# Patient Record
Sex: Female | Born: 2005 | Race: Black or African American | Hispanic: Yes | Marital: Single | Smoking: Never smoker
Health system: Southern US, Community
[De-identification: ages and names within clinical notes are randomized; demographics above are authoritative.]

## PROBLEM LIST (undated history)

## (undated) DIAGNOSIS — F32A Depression, unspecified: Secondary | ICD-10-CM

---
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
## 2024-01-15 ENCOUNTER — Emergency Department (HOSPITAL_COMMUNITY)
Admission: EM | Admit: 2024-01-15 | Discharge: 2024-01-16 | Disposition: A | Discharge status: Home / Self Care | Payer: MEDICAID | Attending: Emergency Medicine | Admitting: Emergency Medicine

## 2024-01-15 ENCOUNTER — Other Ambulatory Visit: Payer: Self-pay

## 2024-01-15 ENCOUNTER — Encounter (HOSPITAL_COMMUNITY): Payer: Self-pay | Admitting: *Deleted

## 2024-01-15 DIAGNOSIS — R112 Nausea with vomiting, unspecified: Secondary | ICD-10-CM | POA: Diagnosis present

## 2024-01-15 DIAGNOSIS — E876 Hypokalemia: Secondary | ICD-10-CM | POA: Insufficient documentation

## 2024-01-15 DIAGNOSIS — R197 Diarrhea, unspecified: Secondary | ICD-10-CM | POA: Insufficient documentation

## 2024-01-15 LAB — CBC
HCT: 36.9 % (ref 36.0–46.0)
Hemoglobin: 10.5 g/dL — ABNORMAL LOW (ref 12.0–15.0)
MCH: 19.4 pg — ABNORMAL LOW (ref 26.0–34.0)
MCHC: 28.5 g/dL — ABNORMAL LOW (ref 30.0–36.0)
MCV: 68.2 fL — ABNORMAL LOW (ref 80.0–100.0)
Platelets: 317 K/uL (ref 150–400)
RBC: 5.41 MIL/uL — ABNORMAL HIGH (ref 3.87–5.11)
RDW: 17.3 % — ABNORMAL HIGH (ref 11.5–15.5)
WBC: 8 K/uL (ref 4.0–10.5)
nRBC: 0 % (ref 0.0–0.2)

## 2024-01-15 NOTE — ED Triage Notes (Signed)
 The pt is c/o abd pain  with n v and diarrhea since Tuesday   no temp   lmp aug 20th

## 2024-01-16 LAB — URINALYSIS, ROUTINE W REFLEX MICROSCOPIC
Bilirubin Urine: NEGATIVE
Glucose, UA: NEGATIVE mg/dL
Ketones, ur: NEGATIVE mg/dL
Nitrite: NEGATIVE
Protein, ur: NEGATIVE mg/dL
Specific Gravity, Urine: 1.03 (ref 1.005–1.030)
pH: 5 (ref 5.0–8.0)

## 2024-01-16 LAB — COMPREHENSIVE METABOLIC PANEL WITH GFR
ALT: 16 U/L (ref 0–44)
AST: 20 U/L (ref 15–41)
Albumin: 4 g/dL (ref 3.5–5.0)
Alkaline Phosphatase: 73 U/L (ref 38–126)
Anion gap: 11 (ref 5–15)
BUN: 12 mg/dL (ref 6–20)
CO2: 23 mmol/L (ref 22–32)
Calcium: 9.3 mg/dL (ref 8.9–10.3)
Chloride: 107 mmol/L (ref 98–111)
Creatinine, Ser: 0.77 mg/dL (ref 0.44–1.00)
GFR, Estimated: >60 mL/min (ref >60)
Glucose, Bld: 84 mg/dL (ref 70–99)
Potassium: 3.2 mmol/L — ABNORMAL LOW (ref 3.5–5.1)
Sodium: 141 mmol/L (ref 135–145)
Total Bilirubin: 0.3 mg/dL (ref 0.0–1.2)
Total Protein: 7.8 g/dL (ref 6.5–8.1)

## 2024-01-16 LAB — HCG, SERUM, QUALITATIVE: Preg, Serum: NEGATIVE

## 2024-01-16 LAB — LIPASE, BLOOD: Lipase: 29 U/L (ref 11–51)

## 2024-01-16 MED ORDER — ONDANSETRON HCL 4 MG/2ML IJ SOLN
4.0000 mg | Freq: Once | INTRAMUSCULAR | Status: AC
  Administered 2024-01-16: 4 mg via INTRAVENOUS
  Filled 2024-01-16: qty 2

## 2024-01-16 MED ORDER — MORPHINE SULFATE (PF) 4 MG/ML IV SOLN
4.0000 mg | Freq: Once | INTRAVENOUS | Status: AC
  Administered 2024-01-16: 4 mg via INTRAVENOUS
  Filled 2024-01-16: qty 1

## 2024-01-16 MED ORDER — SODIUM CHLORIDE 0.9 % IV BOLUS
1000.0000 mL | Freq: Once | INTRAVENOUS | Status: AC
  Administered 2024-01-16: 1000 mL via INTRAVENOUS

## 2024-01-16 MED ORDER — ONDANSETRON HCL 4 MG PO TABS: 4.0000 mg | ORAL_TABLET | Freq: Four times a day (QID) | ORAL | 0 refills | Status: DC

## 2024-01-16 MED ORDER — POTASSIUM CHLORIDE CRYS ER 20 MEQ PO TBCR
40.0000 meq | EXTENDED_RELEASE_TABLET | Freq: Once | ORAL | Status: AC
  Administered 2024-01-16: 40 meq via ORAL
  Filled 2024-01-16: qty 2

## 2024-01-16 MED ORDER — FERROUS SULFATE 325 (65 FE) MG PO TABS: 325.0000 mg | ORAL_TABLET | Freq: Every day | ORAL | 0 refills | Status: AC

## 2024-01-16 NOTE — ED Provider Notes (Signed)
 [facility] EMERGENCY DEPARTMENT AT [facility] Provider Note   CSN: [id number] Arrival date & time: 01/15/24  2254     Patient presents with: Abdominal Pain  HPI Allison Melendez is a 18 y.o. female presenting for abdominal pain nausea vomiting and diarrhea.  Started this past Tuesday.  States the vomiting stopped about 3 days ago but still having diarrhea.  Denies any recent new medications or antibiotics.  Abdominal pain is on the left side of the abdomen and nonradiating otherwise.  Denies urinary or vaginal symptoms but states she may be on her period.  Denies recent trips abroad.  Denies recent alcohol or marijuana use. Also requesting refill on iron supplement.     Abdominal Pain      Prior to Admission medications   Medication Sig Start Date End Date Taking? Authorizing Provider  ondansetron  (ZOFRAN ) 4 MG tablet Take 1 tablet (4 mg total) by mouth every 6 (six) hours. 01/16/24  Yes Lang Norleen POUR, PA-C  cyclobenzaprine  (FLEXERIL ) 10 MG tablet Take 1 tablet (10 mg total) by mouth 2 (two) times daily as needed for muscle spasms. 10/25/22   Schutt, Marsa HERO, PA-C  dexmethylphenidate (FOCALIN XR) 5 MG 24 hr capsule Take 5 mg by mouth daily. 02/01/22   [provider]  escitalopram (LEXAPRO) 10 MG tablet Take by mouth. 01/27/22   [provider]  ferrous sulfate 325 (65 FE) MG tablet Take 1 tablet (325 mg total) by mouth daily. 01/16/24 02/15/24  Lang Norleen POUR, PA-C  ondansetron  (ZOFRAN -ODT) 4 MG disintegrating tablet Take 1 tablet (4 mg total) by mouth every 8 (eight) hours as needed for nausea or vomiting. 04/08/23   Rolinda Rogue, MD  oseltamivir  (TAMIFLU ) 75 MG capsule Take 1 capsule (75 mg total) by mouth 2 (two) times daily. 05/13/22   Piontek, Rocky, MD  pantoprazole  (PROTONIX ) 40 MG tablet Take 1 tablet (40 mg total) by mouth daily. 04/08/23   Rolinda Rogue, MD    Allergies: Patient has no known allergies.    Review of Systems   Gastrointestinal:  Positive for abdominal pain.    Updated Vital Signs BP (!) 136/118   Pulse 75   Temp 98.6 F (37 C)   Resp 18   Ht 5' 4 (1.626 m)   Wt 112.1 kg   LMP 12/09/2023   SpO2 100%   BMI 42.42 kg/m   Physical Exam Vitals and nursing note reviewed.  HENT:     Head: Normocephalic and atraumatic.     Mouth/Throat:     Mouth: Mucous membranes are moist.  Eyes:     General:        Right eye: No discharge.        Left eye: No discharge.     Conjunctiva/sclera: Conjunctivae normal.  Cardiovascular:     Rate and Rhythm: Normal rate and regular rhythm.     Pulses: Normal pulses.     Heart sounds: Normal heart sounds.  Pulmonary:     Effort: Pulmonary effort is normal.     Breath sounds: Normal breath sounds.  Abdominal:     General: Abdomen is flat. There is no distension.     Palpations: Abdomen is soft.     Tenderness: There is no abdominal tenderness.  Skin:    General: Skin is warm and dry.  Neurological:     General: No focal deficit present.  Psychiatric:        Mood and Affect: Mood normal.     (all  labs ordered are listed, but only abnormal results are displayed) Labs Reviewed  COMPREHENSIVE METABOLIC PANEL WITH GFR - Abnormal; Notable for the following components:      Result Value   Potassium 3.2 (*)    All other components within normal limits  CBC - Abnormal; Notable for the following components:   RBC 5.41 (*)    Hemoglobin 10.5 (*)    MCV 68.2 (*)    MCH 19.4 (*)    MCHC 28.5 (*)    RDW 17.3 (*)    All other components within normal limits  URINALYSIS, ROUTINE W REFLEX MICROSCOPIC - Abnormal; Notable for the following components:   APPearance CLOUDY (*)    Hgb urine dipstick SMALL (*)    Leukocytes,Ua MODERATE (*)    Bacteria, UA RARE (*)    All other components within normal limits  LIPASE, BLOOD  HCG, SERUM, QUALITATIVE    EKG: None  Radiology: No results found.   Procedures   Medications Ordered in the ED  morphine  (PF) 4 MG/ML injection 4 mg (4 mg Intravenous Given 01/16/24 0955)  sodium chloride 0.9 % bolus 1,000 mL (1,000 mLs Intravenous New Bag/Given 01/16/24 0954)  ondansetron  (ZOFRAN ) injection 4 mg (4 mg Intravenous Given 01/16/24 0955)  potassium chloride SA (KLOR-CON M) CR tablet 40 mEq (40 mEq Oral Given 01/16/24 0955)                                    Medical Decision Making  Initial Impression and Ddx 18 year old well-appearing female presenting for abdominal pain.  Exam unremarkable.  DDx includes ectopic pregnancy, ovarian torsion, appendicitis, acute cholecystitis, kidney stone, acute pancreatitis, electrolyte derangement, other. Patient PMH that increases complexity of ED encounter:  none  Interpretation of Diagnostics I independent reviewed and interpreted the labs as followed: mild anemia 10.5, hematuria, pyuria, mucus and squamous cells present urine   Patient Reassessment and Ultimate Disposition/Management Workup overall reassuring.  Suspicion for intra-abdominal pathology is low given reassuring abdominal exam and lab work and patient appears well and nontoxic.  Vitals are also reassuring with slightly elevated blood pressure.  Urine was somewhat concerning but given lack of urinary symptoms and patient did mention that she may be menstruating is likely contamination.  Sent Zofran  to her pharmacy.  Fluid challenge here with no issue.  Discussed return precautions and advised PCP follow-up.  Discharged in good condition.  Patient management required discussion with the following services or consulting groups:  None  Complexity of Problems Addressed Acute complicated illness or Injury  Additional Data Reviewed and Analyzed Further history obtained from: Past medical history and medications listed in the EMR and Prior ED visit notes  Patient Encounter Risk Assessment Consideration of hospitalization      Final diagnoses:  Nausea vomiting and diarrhea    ED Discharge  Orders          Ordered    ferrous sulfate 325 (65 FE) MG tablet  Daily        01/16/24 0946    ondansetron  (ZOFRAN ) 4 MG tablet  Every 6 hours        01/16/24 1042               Lang Norleen POUR, PA-C 01/16/24 1042    Rogelia Jerilynn RAMAN, MD 01/20/24 [phone]

## 2024-01-16 NOTE — Discharge Instructions (Signed)
 Evaluation today was reassuring.  I did send refills of your iron supplement to your pharmacy as well as Zofran  to help with nausea and vomiting.  Please follow-up with your primary care doctor.  If you have worsening abdominal pain, develop a fever, cannot tolerate fluid intake or any other concerning symptom please return to the ED for further evaluation.

## 2024-02-04 ENCOUNTER — Other Ambulatory Visit: Payer: Self-pay

## 2024-02-04 ENCOUNTER — Encounter (HOSPITAL_COMMUNITY): Payer: Self-pay

## 2024-02-04 ENCOUNTER — Ambulatory Visit (HOSPITAL_COMMUNITY)
Admission: RE | Admit: 2024-02-04 | Discharge: 2024-02-04 | Disposition: A | Discharge status: Home / Self Care | Payer: MEDICAID | Source: Ambulatory Visit | Attending: Internal Medicine | Admitting: Internal Medicine

## 2024-02-04 VITALS — BP 134/90 | HR 78 | Temp 99.4°F | Resp 20

## 2024-02-04 DIAGNOSIS — N926 Irregular menstruation, unspecified: Secondary | ICD-10-CM | POA: Insufficient documentation

## 2024-02-04 DIAGNOSIS — Z3202 Encounter for pregnancy test, result negative: Secondary | ICD-10-CM | POA: Insufficient documentation

## 2024-02-04 DIAGNOSIS — N898 Other specified noninflammatory disorders of vagina: Secondary | ICD-10-CM | POA: Insufficient documentation

## 2024-02-04 DIAGNOSIS — R300 Dysuria: Secondary | ICD-10-CM | POA: Diagnosis present

## 2024-02-04 DIAGNOSIS — N3001 Acute cystitis with hematuria: Secondary | ICD-10-CM | POA: Insufficient documentation

## 2024-02-04 LAB — POCT URINALYSIS DIP (MANUAL ENTRY)
Bilirubin, UA: NEGATIVE
Glucose, UA: NEGATIVE mg/dL
Ketones, POC UA: NEGATIVE mg/dL
Nitrite, UA: NEGATIVE
Protein Ur, POC: NEGATIVE mg/dL
Spec Grav, UA: 1.025 (ref 1.010–1.025)
Urobilinogen, UA: 0.2 U/dL
pH, UA: 5.5 (ref 5.0–8.0)

## 2024-02-04 LAB — POCT URINE PREGNANCY: Preg Test, Ur: NEGATIVE

## 2024-02-04 LAB — HIV ANTIBODY (ROUTINE TESTING W REFLEX): HIV Screen 4th Generation wRfx: NONREACTIVE

## 2024-02-04 MED ORDER — CEPHALEXIN 500 MG PO CAPS: 500.0000 mg | ORAL_CAPSULE | Freq: Two times a day (BID) | ORAL | 0 refills | Status: AC

## 2024-02-04 NOTE — ED Provider Notes (Signed)
 [facility]    CSN: [id number] Arrival date & time: 02/04/24  1402      History   Chief Complaint Chief Complaint  Patient presents with   Abdominal Pain    I have UTI/BV and its causing bad stomach pain and burns to pee and smells down in my private area. - Entered by patient   Appointment    2:00    HPI Allison Melendez is a 18 y.o. female.   Allison Melendez is a 18 y.o. female presenting for chief complaint of dysuria, urinary frequency, urinary urgency, sensation of incomplete bladder emptying, and suprapubic pressure that started approximately 1 week ago.  She additionally complains of vaginal discharge that is brown in color, thin, and with vaginal odor/irritation.  She is sexually active unprotected and in a monogamous relationship with her boyfriend.  No known exposures to STDs or concern for STD, however she would like to be tested for STDs today just to make sure.  Reports intermittent nausea without vomiting.  She is experiencing pain to the suprapubic abdomen.  Denies low back pain, fever, chills, flank pain, diarrhea, blood/mucus in the stool, gross hematuria, recent antibiotic or steroid use, and history of frequent UTIs.  She consistently voids after intercourse and denies use of SGLT2 inhibitor.  She has not attempted treatment of symptoms at home.  Last menstrual cycle started on December 09, 2023.  Her menstrual cycles are usually very regular and it is abnormal for her to skip a cycle.  She is unsure if she could be pregnant and does not use any form of birth control.   Abdominal Pain   History reviewed. No pertinent past medical history.  There are no active problems to display for this patient.   History reviewed. No pertinent surgical history.  OB History   No obstetric history on file.      Home Medications    Prior to Admission medications   Medication Sig Start Date End Date Taking? Authorizing Provider  cephALEXin (KEFLEX) 500 MG  capsule Take 1 capsule (500 mg total) by mouth 2 (two) times daily for 7 days. 02/04/24 02/11/24 Yes StanhopeDorna HERO, FNP  cyclobenzaprine  (FLEXERIL ) 10 MG tablet Take 1 tablet (10 mg total) by mouth 2 (two) times daily as needed for muscle spasms. 10/25/22   Schutt, Marsa HERO, PA-C  dexmethylphenidate (FOCALIN XR) 5 MG 24 hr capsule Take 5 mg by mouth daily. 02/01/22   [provider]  escitalopram (LEXAPRO) 10 MG tablet Take by mouth. 01/27/22   [provider]  ferrous sulfate 325 (65 FE) MG tablet Take 1 tablet (325 mg total) by mouth daily. 01/16/24 02/15/24  Robinson, John K, PA-C  ondansetron  (ZOFRAN ) 4 MG tablet Take 1 tablet (4 mg total) by mouth every 6 (six) hours. 01/16/24   Robinson, John K, PA-C  ondansetron  (ZOFRAN -ODT) 4 MG disintegrating tablet Take 1 tablet (4 mg total) by mouth every 8 (eight) hours as needed for nausea or vomiting. 04/08/23   Rolinda Rogue, MD  oseltamivir  (TAMIFLU ) 75 MG capsule Take 1 capsule (75 mg total) by mouth 2 (two) times daily. 05/13/22   Piontek, Rocky, MD  pantoprazole  (PROTONIX ) 40 MG tablet Take 1 tablet (40 mg total) by mouth daily. 04/08/23   Rolinda Rogue, MD    Family History History reviewed. No pertinent family history.  Social History Social History   Tobacco Use   Smoking status: Never   Smokeless tobacco: Never  Vaping Use   Vaping status:  Every Day  Substance Use Topics   Alcohol use: No   Drug use: No     Allergies   Patient has no known allergies.   Review of Systems Review of Systems  Gastrointestinal:  Positive for abdominal pain.  Per HPI   Physical Exam Triage Vital Signs ED Triage Vitals  Encounter Vitals Group     BP 02/04/24 1425 (!) 134/90     Girls Systolic BP Percentile --      Girls Diastolic BP Percentile --      Boys Systolic BP Percentile --      Boys Diastolic BP Percentile --      Pulse Rate 02/04/24 1425 78     Resp 02/04/24 1425 20     Temp 02/04/24 1425 99.4 F (37.4  C)     Temp Source 02/04/24 1425 Oral     SpO2 02/04/24 1425 97 %     Weight --      Height --      Head Circumference --      Peak Flow --      Pain Score 02/04/24 1422 7     Pain Loc --      Pain Education --      Exclude from Growth Chart --    No data found.  Updated Vital Signs BP (!) 134/90 (BP Location: Right Arm) Comment (BP Location): large cuff  Pulse 78   Temp 99.4 F (37.4 C) (Oral)   Resp 20   LMP 12/09/2023 (Approximate)   SpO2 97%   Visual Acuity Right Eye Distance:   Left Eye Distance:   Bilateral Distance:    Right Eye Near:   Left Eye Near:    Bilateral Near:     Physical Exam Vitals and nursing note reviewed.  Constitutional:      Appearance: She is not ill-appearing or toxic-appearing.  HENT:     Head: Normocephalic and atraumatic.     Right Ear: Hearing and external ear normal.     Left Ear: Hearing and external ear normal.     Nose: Nose normal.     Mouth/Throat:     Lips: Pink.  Eyes:     General: Lids are normal. Vision grossly intact. Gaze aligned appropriately.     Extraocular Movements: Extraocular movements intact.     Conjunctiva/sclera: Conjunctivae normal.  Pulmonary:     Effort: Pulmonary effort is normal.  Abdominal:     General: Bowel sounds are normal.     Palpations: Abdomen is soft.     Tenderness: There is no abdominal tenderness. There is no right CVA tenderness, left CVA tenderness or guarding.  Musculoskeletal:     Cervical back: Neck supple.  Skin:    General: Skin is warm and dry.     Capillary Refill: Capillary refill takes less than 2 seconds.     Findings: No rash.  Neurological:     General: No focal deficit present.     Mental Status: She is alert and oriented to person, place, and time. Mental status is at baseline.     Cranial Nerves: No dysarthria or facial asymmetry.  Psychiatric:        Mood and Affect: Mood normal.        Speech: Speech normal.        Behavior: Behavior normal.        Thought  Content: Thought content normal.        Judgment: Judgment normal.  UC Treatments / Results  Labs (all labs ordered are listed, but only abnormal results are displayed) Labs Reviewed  POCT URINALYSIS DIP (MANUAL ENTRY) - Abnormal; Notable for the following components:      Result Value   Clarity, UA cloudy (*)    Blood, UA trace-intact (*)    Leukocytes, UA Small (1+) (*)    All other components within normal limits  POCT URINE PREGNANCY - Normal  RPR  HIV ANTIBODY (ROUTINE TESTING W REFLEX)    EKG   Radiology No results found.  Procedures Procedures (including critical care time)  Medications Ordered in UC Medications - No data to display  Initial Impression / Assessment and Plan / UC Course  I have reviewed the triage vital signs and the nursing notes.  Pertinent labs & imaging results that were available during my care of the patient were reviewed by me and considered in my medical decision making (see chart for details).   1. Acute cystitis with hematuria, dysuria Evaluation suggests acute cystitis based on presentation and urinalysis findings in clinic.  Urine culture pending.  Low suspicion for acute pyelonephritis, kidney stone or infected stone.  Keflex antibiotic ordered. Appears well hydrated, therefore will defer labs/imaging.  Vitals stable.  Patient to push fluids to stay well hydrated and reduce intake of known urinary irritants.  Discussed methods of preventing future UTI.   2. Vaginal discharge, missed menses, negative pregnancy test STI labs pending, will notify patient of positive results and treat accordingly per protocol when labs result.   Patient would like HIV and syphilis testing today.   Patient to avoid sexual intercourse until screening testing comes back.   Education provided regarding safe sexual practices and patient encouraged to use protection to prevent spread of STIs.  Negative urine pregnancy test.   Counseled patient on  potential for adverse effects with medications prescribed/recommended today, strict ER and return-to-clinic precautions discussed, patient verbalized understanding.    Final Clinical Impressions(s) / UC Diagnoses   Final diagnoses:  Missed menses  Dysuria  Acute cystitis with hematuria  Negative pregnancy test  Vaginal discharge     Discharge Instructions      Your urine shows you likely have a urinary tract infection.   I have sent your urine for culture to confirm this.   We will call you if we need to change your antibiotic when we find out the type of bacteria growing in your bladder.  Take antibiotic as directed with a snack/food to avoid stomach upset. To avoid GI upset please take this medication with food.   Avoid drinking beverages that irritate the urinary tract like sodas, tea, coffee, or juice. Drink plenty of water to stay well hydrated and prevent severe infection.  If you develop pain in your upper back on one side, fever despite taking antibiotic, nausea and vomiting where you cannot keep anything down for 24 hours, dizziness/severe headache, or decreased urinary output, please go to the ER.   STD testing pending, this will take 2-3 days to result. We will only call you if your testing is positive for any infection(s) and we will provide treatment.  Avoid sexual intercourse until your STD results come back.  If any of your STD results are positive, you will need to avoid sexual intercourse for 7 days while you are being treated to prevent spread of STD.  Condom use is the best way to prevent spread of STDs. Notify partner(s) of any positive results.  Return to urgent  care as needed.       ED Prescriptions     Medication Sig Dispense Auth. Provider   cephALEXin (KEFLEX) 500 MG capsule Take 1 capsule (500 mg total) by mouth 2 (two) times daily for 7 days. 14 capsule Enedelia Dorna HERO, FNP      PDMP not reviewed this encounter.   Enedelia Dorna HERO, [place] 02/04/24 1540

## 2024-02-04 NOTE — Discharge Instructions (Addendum)
 Your urine shows you likely have a urinary tract infection.   I have sent your urine for culture to confirm this.   We will call you if we need to change your antibiotic when we find out the type of bacteria growing in your bladder.  Take antibiotic as directed with a snack/food to avoid stomach upset. To avoid GI upset please take this medication with food.   Avoid drinking beverages that irritate the urinary tract like sodas, tea, coffee, or juice. Drink plenty of water to stay well hydrated and prevent severe infection.  If you develop pain in your upper back on one side, fever despite taking antibiotic, nausea and vomiting where you cannot keep anything down for 24 hours, dizziness/severe headache, or decreased urinary output, please go to the ER.   STD testing pending, this will take 2-3 days to result. We will only call you if your testing is positive for any infection(s) and we will provide treatment.  Avoid sexual intercourse until your STD results come back.  If any of your STD results are positive, you will need to avoid sexual intercourse for 7 days while you are being treated to prevent spread of STD.  Condom use is the best way to prevent spread of STDs. Notify partner(s) of any positive results.  Return to urgent care as needed.

## 2024-02-04 NOTE — ED Triage Notes (Signed)
 Complains of abdominal pain, burning with urination, strong odor to urine.  Uses dove sensitive soap.  Has brown vaginal discharge that started last week.    Has not used any medications for these symptoms  LMP August 20th, is not on any birth control.

## 2024-02-05 ENCOUNTER — Ambulatory Visit (HOSPITAL_COMMUNITY): Payer: Self-pay

## 2024-02-05 LAB — URINE CULTURE: Culture: <10000 — AB

## 2024-02-05 LAB — RPR: RPR Ser Ql: NONREACTIVE

## 2024-02-28 ENCOUNTER — Ambulatory Visit (HOSPITAL_COMMUNITY)
Admission: RE | Admit: 2024-02-28 | Discharge: 2024-02-28 | Disposition: A | Discharge status: Home / Self Care | Payer: MEDICAID | Source: Ambulatory Visit

## 2024-02-28 ENCOUNTER — Encounter (HOSPITAL_COMMUNITY): Payer: Self-pay

## 2024-02-28 VITALS — BP 135/98 | HR 84 | Temp 98.8°F | Resp 16

## 2024-02-28 DIAGNOSIS — R300 Dysuria: Secondary | ICD-10-CM | POA: Diagnosis present

## 2024-02-28 DIAGNOSIS — N898 Other specified noninflammatory disorders of vagina: Secondary | ICD-10-CM | POA: Diagnosis not present

## 2024-02-28 HISTORY — DX: Depression, unspecified: F32.A

## 2024-02-28 LAB — POCT URINE DIPSTICK
Bilirubin, UA: NEGATIVE
Blood, UA: NEGATIVE
Glucose, UA: NEGATIVE mg/dL
Nitrite, UA: NEGATIVE
Spec Grav, UA: >=1.03 — AB (ref 1.010–1.025)
Urobilinogen, UA: 0.2 U/dL
pH, UA: 6 (ref 5.0–8.0)

## 2024-02-28 MED ORDER — PHENAZOPYRIDINE HCL 200 MG PO TABS: 200.0000 mg | ORAL_TABLET | Freq: Three times a day (TID) | ORAL | 0 refills | Status: DC

## 2024-02-28 NOTE — ED Provider Notes (Signed)
 [facility]  Note:  This document was prepared using Conservation officer, historic buildings and may include unintentional dictation errors.  MRN: [id number] DOB: [birth year]  Subjective:   Allison Melendez is a 18 y.o. female presenting for evaluation of mild abdominal pain, vaginal discharge, vaginal odor, lower back pain, dysuria x 1 week.  Patient reports that she was treated empirically for a UTI a few weeks ago but was advised to discontinue antibiotics after few days when urine culture revealed no bacterial growth.  Patient states that she used medication as prescribed and symptoms did improve however symptoms came back approximately a week ago.  Patient denies any fever, cough, body aches, fatigue, shortness of breath, chest pain.  Denies any known sick contacts.  Patient also reports that her boyfriend tested positive for mycoplasma genitalium which she would like to be tested for today as well as other STIs.  No current facility-administered medications for this encounter.  Current Outpatient Medications:    phenazopyridine (PYRIDIUM) 200 MG tablet, Take 1 tablet (200 mg total) by mouth 3 (three) times daily., Disp: 6 tablet, Rfl: 0   dexmethylphenidate (FOCALIN XR) 5 MG 24 hr capsule, Take 5 mg by mouth daily. (Patient not taking: Reported on 02/28/2024), Disp: , Rfl:    escitalopram (LEXAPRO) 10 MG tablet, Take by mouth. (Patient not taking: Reported on 02/28/2024), Disp: , Rfl:    ferrous sulfate 325 (65 FE) MG tablet, Take 1 tablet (325 mg total) by mouth daily., Disp: 30 tablet, Rfl: 0   ondansetron  (ZOFRAN ) 4 MG tablet, Take 1 tablet (4 mg total) by mouth every 6 (six) hours., Disp: 12 tablet, Rfl: 0   No Known Allergies  Past Medical History:  Diagnosis Date   Depression      History reviewed. No pertinent surgical history.  History reviewed. No pertinent family history.  Social History   Tobacco Use   Smoking status: Never   Smokeless tobacco: Never   Vaping Use   Vaping status: Every Day  Substance Use Topics   Alcohol use: Yes    Comment: occasionally   Drug use: Never    ROS Refer to HPI for ROS details.  Objective:    Vitals: BP (!) 135/98 (BP Location: Left Arm)   Pulse 84   Temp 98.8 F (37.1 C) (Oral)   Resp 16   LMP 02/17/2024 (Approximate)   SpO2 98%   Physical Exam Vitals and nursing note reviewed.  Constitutional:      General: She is not in acute distress.    Appearance: She is well-developed. She is not ill-appearing or toxic-appearing.  HENT:     Head: Normocephalic and atraumatic.  Cardiovascular:     Rate and Rhythm: Normal rate.  Pulmonary:     Effort: Pulmonary effort is normal. No respiratory distress.     Breath sounds: No stridor. No wheezing.  Abdominal:     General: There is no distension.     Palpations: Abdomen is soft.     Tenderness: There is no abdominal tenderness. There is no right CVA tenderness, left CVA tenderness, guarding or rebound.  Genitourinary:    Vagina: Vaginal discharge present. No bleeding or lesions.  Skin:    General: Skin is warm and dry.  Neurological:     General: No focal deficit present.     Mental Status: She is alert and oriented to person, place, and time.  Psychiatric:        Mood and Affect: Mood normal.  Behavior: Behavior normal.     Procedures  Results for orders placed or performed during the hospital encounter of 02/28/24 (from the past 24 hours)  POC Urinalysis Dipstick     Status: Abnormal   Collection Time: 02/28/24  3:11 PM  Result Value Ref Range   Color, UA yellow yellow   Clarity, UA turbid (A) clear   Glucose, UA negative negative mg/dL   Bilirubin, UA negative negative   Ketones, POC UA trace (5) (A) negative mg/dL   Spec Grav, UA >=8.969 (A) 1.010 - 1.025   Blood, UA negative negative   pH, UA 6.0 5.0 - 8.0   POC PROTEIN,UA trace negative, trace   Urobilinogen, UA 0.2 0.2 or 1.0 E.U./dL   Nitrite, UA Negative Negative    Leukocytes, UA Trace (A) Negative    Assessment and Plan :     Discharge Instructions       1. Vaginal discharge (Primary) - Cervicovaginal swab collected in UC and sent to lab for further testing results will be available in 2 to 3 days - Mycoplasma genitalium, NAA, Swab collected in UC and sent to lab for further testing results should be available in 2 to 3 days  2. Dysuria - POC Urinalysis Dipstick completed in UC is positive for trace leukocytes, negative for nitrite, negative for blood, these findings are not strongly indicative of urinary infection but urine culture will be sent for further testing - Urine Culture collected in UC and sent to lab for further testing results should be available in 2 to 3 days. - phenazopyridine (PYRIDIUM) 200 MG tablet; Take 1 tablet (200 mg total) by mouth 3 (three) times daily.  Dispense: 6 tablet; Refill: 0 -Continue to monitor symptoms for any change in severity if there is any escalation of current symptoms or development of new symptoms follow-up in ER for further evaluation and management.      Talina Pleitez B Venisa Frampton   Carlyle Mcelrath, [place], [place] 02/28/24 1539

## 2024-02-28 NOTE — ED Triage Notes (Signed)
 Patient here today with c/o abd pain, vaginal discharge, vaginal odor, and low back pain X 1 week. Patient was here a few weeks ago. Patient was taken a medication but was told to stop it due to negative results. Patient states that while she was taking the medication, she started to feel better but symptoms returned a week ago.

## 2024-02-28 NOTE — Discharge Instructions (Signed)
  1. Vaginal discharge (Primary) - Cervicovaginal swab collected in UC and sent to lab for further testing results will be available in 2 to 3 days - Mycoplasma genitalium, NAA, Swab collected in UC and sent to lab for further testing results should be available in 2 to 3 days  2. Dysuria - POC Urinalysis Dipstick completed in UC is positive for trace leukocytes, negative for nitrite, negative for blood, these findings are not strongly indicative of urinary infection but urine culture will be sent for further testing - Urine Culture collected in UC and sent to lab for further testing results should be available in 2 to 3 days. - phenazopyridine (PYRIDIUM) 200 MG tablet; Take 1 tablet (200 mg total) by mouth 3 (three) times daily.  Dispense: 6 tablet; Refill: 0 -Continue to monitor symptoms for any change in severity if there is any escalation of current symptoms or development of new symptoms follow-up in ER for further evaluation and management.

## 2024-02-29 LAB — URINE CULTURE
Culture: NO GROWTH
Special Requests: NORMAL

## 2024-02-29 LAB — CERVICOVAGINAL ANCILLARY ONLY
Bacterial Vaginitis (gardnerella): NEGATIVE
Candida Glabrata: NEGATIVE
Candida Vaginitis: NEGATIVE
Chlamydia: POSITIVE — AB
Comment: NEGATIVE
Comment: NEGATIVE
Comment: NEGATIVE
Comment: NEGATIVE
Comment: NEGATIVE
Comment: NORMAL
Neisseria Gonorrhea: NEGATIVE
Trichomonas: NEGATIVE

## 2024-03-01 ENCOUNTER — Ambulatory Visit (HOSPITAL_COMMUNITY): Payer: Self-pay

## 2024-03-01 DIAGNOSIS — A749 Chlamydial infection, unspecified: Secondary | ICD-10-CM

## 2024-03-01 MED ORDER — DOXYCYCLINE HYCLATE 100 MG PO TABS
100.0000 mg | ORAL_TABLET | Freq: Two times a day (BID) | ORAL | 0 refills | Status: DC
Start: 2024-03-01 — End: 2024-03-08

## 2024-03-02 LAB — MISC LABCORP TEST (SEND OUT): Labcorp test code: 180076

## 2024-03-02 MED ORDER — DOXYCYCLINE HYCLATE 100 MG PO TABS: 100.0000 mg | ORAL_TABLET | Freq: Two times a day (BID) | ORAL | 0 refills | Status: AC

## 2024-03-02 NOTE — Telephone Encounter (Signed)
 Test results positive for chlamydia.  Please take prescribed doxycycline 100 mg twice daily for 7 days for treatment of chlamydia infection.  Please return to urgent care or go to the ER for further evaluation if any symptoms change or current symptoms persist.

## 2024-03-13 ENCOUNTER — Encounter (HOSPITAL_COMMUNITY): Payer: Self-pay

## 2024-03-13 ENCOUNTER — Ambulatory Visit (HOSPITAL_COMMUNITY)
Admission: RE | Admit: 2024-03-13 | Discharge: 2024-03-13 | Disposition: A | Discharge status: Home / Self Care | Payer: MEDICAID | Source: Ambulatory Visit | Attending: Internal Medicine | Admitting: Internal Medicine

## 2024-03-13 VITALS — BP 135/73 | HR 95 | Temp 99.4°F | Resp 16 | Ht 64.0 in | Wt 230.0 lb

## 2024-03-13 DIAGNOSIS — R350 Frequency of micturition: Secondary | ICD-10-CM | POA: Insufficient documentation

## 2024-03-13 DIAGNOSIS — N898 Other specified noninflammatory disorders of vagina: Secondary | ICD-10-CM | POA: Insufficient documentation

## 2024-03-13 DIAGNOSIS — R1084 Generalized abdominal pain: Secondary | ICD-10-CM | POA: Insufficient documentation

## 2024-03-13 DIAGNOSIS — K5900 Constipation, unspecified: Secondary | ICD-10-CM | POA: Insufficient documentation

## 2024-03-13 LAB — POCT URINE DIPSTICK
Bilirubin, UA: NEGATIVE
Blood, UA: NEGATIVE
Glucose, UA: NEGATIVE mg/dL
Ketones, POC UA: NEGATIVE mg/dL
Nitrite, UA: NEGATIVE
POC PROTEIN,UA: NEGATIVE
Spec Grav, UA: 1.02 (ref 1.010–1.025)
Urobilinogen, UA: 0.2 U/dL
pH, UA: 7.5 (ref 5.0–8.0)

## 2024-03-13 MED ORDER — DOCUSATE SODIUM 100 MG PO CAPS: 100.0000 mg | ORAL_CAPSULE | Freq: Every day | ORAL | 0 refills | Status: AC

## 2024-03-13 MED ORDER — POLYETHYLENE GLYCOL 3350 17 GM/SCOOP PO POWD: 17.0000 g | Freq: Every day | ORAL | 0 refills | Status: AC

## 2024-03-13 NOTE — ED Provider Notes (Signed)
 [facility]    CSN: [id number] Arrival date & time: 03/13/24  1517      History   Chief Complaint Chief Complaint  Patient presents with   Follow-up    tested positive for chlamydia like 2 weeks ago, took antibiotics 2 times a day was feeling better, and on the 5th day i started getting the symptoms back, i feel like i've been reinfected so i want a follow up because i've been nauseous having pains. - Entered by patient    HPI Allison Melendez is a 18 y.o. female.   Allison Melendez is a 18 y.o. female presenting for chief complaint of nausea, pelvic pain, vaginal discharge, vaginal itching, vaginal odor, and cloudy urine that started a week ago. She was recently diagnosed with chlamydia on March 02, 2024, took doxycycline  for 7 days, had improvement in symptoms for 5 of those 7 days, then the symptoms came back. Vaginal discharge is brown and thin with foul odor.  Her menstrual cycles are irregular, however she suspect she is supposed to start her menstrual cycle soon.  LMP February 17, 2024.  She has not been sexually active since she was tested for STDs 2 weeks ago.  Urine is cloudy and she reports associated urinary frequency and urgency without dysuria or gross hematuria.  She reports hot and cold chills without documented fever at home.  She's intermittently nauseous without vomiting.   Stools have been hard and small over the last few days, history of frequent constipation.  Denies blood or mucus in the stools, diarrhea, and pain with stooling.   She has not attempted use of any over-the-counter medications to help with symptoms prior to arrival.     Past Medical History:  Diagnosis Date   Depression     There are no active problems to display for this patient.   History reviewed. No pertinent surgical history.  OB History   No obstetric history on file.      Home Medications    Prior to Admission medications   Medication Sig Start Date End  Date Taking? Authorizing Provider  docusate sodium  (COLACE) 100 MG capsule Take 1 capsule (100 mg total) by mouth daily. 03/13/24  Yes Enedelia Dorna HERO, FNP  ferrous sulfate  325 (65 FE) MG tablet Take 1 tablet (325 mg total) by mouth daily. 01/16/24 03/13/24 Yes Robinson, John K, PA-C  polyethylene glycol powder (GLYCOLAX /MIRALAX ) 17 GM/SCOOP powder Take 17 g by mouth daily. 03/13/24  Yes Enedelia Dorna HERO, FNP  dexmethylphenidate (FOCALIN XR) 5 MG 24 hr capsule Take 5 mg by mouth daily. Patient not taking: Reported on 02/28/2024 02/01/22   [provider]  escitalopram (LEXAPRO) 10 MG tablet Take by mouth. Patient not taking: Reported on 02/28/2024 01/27/22   [provider]    Family History History reviewed. No pertinent family history.  Social History Social History   Tobacco Use   Smoking status: Never   Smokeless tobacco: Never  Vaping Use   Vaping status: Every Day  Substance Use Topics   Alcohol use: Yes    Comment: occasionally   Drug use: Never     Allergies   Patient has no known allergies.   Review of Systems Review of Systems Per HPI  Physical Exam Triage Vital Signs ED Triage Vitals  Encounter Vitals Group     BP 03/13/24 1556 135/73     Girls Systolic BP Percentile --      Girls Diastolic BP Percentile --  Boys Systolic BP Percentile --      Boys Diastolic BP Percentile --      Pulse Rate 03/13/24 1556 95     Resp 03/13/24 1556 16     Temp 03/13/24 1556 99.4 F (37.4 C)     Temp Source 03/13/24 1556 Oral     SpO2 03/13/24 1556 100 %     Weight 03/13/24 1610 230 lb (104.3 kg)     Height 03/13/24 1610 5' 4 (1.626 m)     Head Circumference --      Peak Flow --      Pain Score 03/13/24 1554 8     Pain Loc --      Pain Education --      Exclude from Growth Chart --    No data found.  Updated Vital Signs BP 135/73 (BP Location: Right Arm)   Pulse 95   Temp 99.4 F (37.4 C) (Oral)   Resp 16   Ht 5' 4 (1.626 m)    Wt 230 lb (104.3 kg)   LMP 02/17/2024 (Approximate)   SpO2 100%   BMI 39.48 kg/m   Visual Acuity Right Eye Distance:   Left Eye Distance:   Bilateral Distance:    Right Eye Near:   Left Eye Near:    Bilateral Near:     Physical Exam Vitals and nursing note reviewed.  Constitutional:      Appearance: She is not ill-appearing or toxic-appearing.  HENT:     Head: Normocephalic and atraumatic.     Right Ear: Hearing and external ear normal.     Left Ear: Hearing and external ear normal.     Nose: Nose normal.     Mouth/Throat:     Lips: Pink.  Eyes:     General: Lids are normal. Vision grossly intact. Gaze aligned appropriately.     Extraocular Movements: Extraocular movements intact.     Conjunctiva/sclera: Conjunctivae normal.  Cardiovascular:     Rate and Rhythm: Normal rate.  Pulmonary:     Effort: Pulmonary effort is normal.  Abdominal:     General: Abdomen is flat. Bowel sounds are normal.     Palpations: Abdomen is soft.     Tenderness: There is abdominal tenderness. There is no right CVA tenderness, left CVA tenderness or guarding.  Musculoskeletal:     Cervical back: Neck supple.  Skin:    General: Skin is warm and dry.     Capillary Refill: Capillary refill takes less than 2 seconds.     Findings: No rash.  Neurological:     General: No focal deficit present.     Mental Status: She is alert and oriented to person, place, and time. Mental status is at baseline.     Cranial Nerves: No dysarthria or facial asymmetry.  Psychiatric:        Mood and Affect: Mood normal.        Speech: Speech normal.        Behavior: Behavior normal.        Thought Content: Thought content normal.        Judgment: Judgment normal.      UC Treatments / Results  Labs (all labs ordered are listed, but only abnormal results are displayed) Labs Reviewed  POCT URINE DIPSTICK - Abnormal; Notable for the following components:      Result Value   Clarity, UA turbid (*)     Leukocytes, UA Trace (*)    All other components  within normal limits  URINE CULTURE  CERVICOVAGINAL ANCILLARY ONLY    EKG   Radiology No results found.  Procedures Procedures (including critical care time)  Medications Ordered in UC Medications - No data to display  Initial Impression / Assessment and Plan / UC Course  I have reviewed the triage vital signs and the nursing notes.  Pertinent labs & imaging results that were available during my care of the patient were reviewed by me and considered in my medical decision making (see chart for details).   1.  Viral discharge, urinary frequency Testing for BV and yeast is pending, we will not retest for STDs as this is not indicated since she was just treated for chlamydia last week and has not been sexually active with new potential exposures.  We will assess for BV and yeast to ensure she has not developed yeast vaginitis secondary to antibiotic use.  Patient is agreeable with this. Urinalysis shows trace leukocytes, urine culture pending to evaluate further for potential UTI.  2.  Constipation Pelvic/abdominal pain is likely caused by constipation.  Will manage this with Miralax  as directed, daily stool softener, and increased water/fiber intake. No peritoneal signs to abdominal exam. No indication for imaging of the abdomen, low suspicion for obstruction etiology.  Return to clinic or go to ER if no bowel movement production in 24-48 hours.   Counseled patient on potential for adverse effects with medications prescribed/recommended today, strict ER and return-to-clinic precautions discussed, patient verbalized understanding.    Final Clinical Impressions(s) / UC Diagnoses   Final diagnoses:  Vaginal discharge  Urinary frequency  Constipation, unspecified constipation type  Generalized abdominal pain     Discharge Instructions      Testing for BV and yeast is pending. Urine culture is pending. Staff will call if  these tests require change in treatment plan.   Your abdominal pain/physical exam findings are consistent with constipation. Start taking MiraLAX  1-2 times daily (12 hours between doses) until you are able to have a soft normal bowel movement.  Once you are able to have a soft normal bowel movement, use Miralax  only once a day for 1-2 days, then as needed.  Increase the amount of fiber you are eating by eating more fruits, vegetables, and whole grains.  Increase your water intake to at least 8 cups of water per day to maintain good hydration.  Take Colace stool softener daily to prevent constipation in the future. You may purchase colace over the counter and take this once daily to help keep stools soft.   If you have not had a bowel movement in the next 2 to 3 days, please return to urgent care.  If you develop any new or worsening symptoms that are severe, please go to the emergency room for further evaluation.        ED Prescriptions     Medication Sig Dispense Auth. Provider   polyethylene glycol powder (GLYCOLAX /MIRALAX ) 17 GM/SCOOP powder Take 17 g by mouth daily. 255 g Enedelia Going M, FNP   docusate sodium  (COLACE) 100 MG capsule Take 1 capsule (100 mg total) by mouth daily. 30 capsule Enedelia Going HERO, FNP      PDMP not reviewed this encounter.   Enedelia Going HERO, [place] 03/13/24 1725

## 2024-03-13 NOTE — ED Triage Notes (Signed)
 Patient here today with c/o nausea, cloudy urine, lower abd pain, vaginal discharge and odor since Tuesday. Patient was previously diagnosed with Chlamydia and completed treatment for that. Patient states that symptoms improved the first 5 days of being on the medication but once the medication was completed the symptoms returned.

## 2024-03-13 NOTE — Discharge Instructions (Signed)
 Testing for BV and yeast is pending. Urine culture is pending. Staff will call if these tests require change in treatment plan.   Your abdominal pain/physical exam findings are consistent with constipation. Start taking MiraLAX  1-2 times daily (12 hours between doses) until you are able to have a soft normal bowel movement.  Once you are able to have a soft normal bowel movement, use Miralax  only once a day for 1-2 days, then as needed.  Increase the amount of fiber you are eating by eating more fruits, vegetables, and whole grains.  Increase your water intake to at least 8 cups of water per day to maintain good hydration.  Take Colace stool softener daily to prevent constipation in the future. You may purchase colace over the counter and take this once daily to help keep stools soft.   If you have not had a bowel movement in the next 2 to 3 days, please return to urgent care.  If you develop any new or worsening symptoms that are severe, please go to the emergency room for further evaluation.

## 2024-03-14 ENCOUNTER — Ambulatory Visit (HOSPITAL_COMMUNITY): Payer: Self-pay

## 2024-03-14 LAB — URINE CULTURE: Culture: NO GROWTH

## 2024-03-14 LAB — CERVICOVAGINAL ANCILLARY ONLY
Bacterial Vaginitis (gardnerella): NEGATIVE
Candida Glabrata: NEGATIVE
Candida Vaginitis: NEGATIVE
Comment: NEGATIVE
Comment: NEGATIVE
Comment: NEGATIVE

## 2024-04-19 ENCOUNTER — Inpatient Hospital Stay (HOSPITAL_COMMUNITY): Admission: RE | Admit: 2024-04-19 | Discharge: 2024-04-19 | Payer: MEDICAID

## 2024-04-19 ENCOUNTER — Encounter (HOSPITAL_COMMUNITY): Payer: Self-pay

## 2024-04-19 VITALS — BP 121/80 | HR 87 | Temp 98.6°F | Resp 18 | Ht 64.0 in | Wt 230.0 lb

## 2024-04-19 DIAGNOSIS — N898 Other specified noninflammatory disorders of vagina: Secondary | ICD-10-CM | POA: Insufficient documentation

## 2024-04-19 DIAGNOSIS — R109 Unspecified abdominal pain: Secondary | ICD-10-CM | POA: Diagnosis not present

## 2024-04-19 DIAGNOSIS — R350 Frequency of micturition: Secondary | ICD-10-CM | POA: Insufficient documentation

## 2024-04-19 LAB — POCT URINE DIPSTICK
Blood, UA: NEGATIVE
Glucose, UA: NEGATIVE mg/dL
Ketones, POC UA: NEGATIVE mg/dL
Leukocytes, UA: NEGATIVE
Nitrite, UA: NEGATIVE
POC PROTEIN,UA: NEGATIVE
Spec Grav, UA: >=1.03 — AB
Urobilinogen, UA: 0.2 U/dL
pH, UA: 5.5

## 2024-04-19 LAB — POCT URINE PREGNANCY: Preg Test, Ur: NEGATIVE

## 2024-04-19 MED ORDER — OMEPRAZOLE 20 MG PO CPDR: 20.0000 mg | DELAYED_RELEASE_CAPSULE | Freq: Two times a day (BID) | ORAL | 0 refills | Status: AC

## 2024-04-19 NOTE — ED Provider Notes (Signed)
 " [facility]    CSN: [id number] Arrival date & time: 04/19/24  9176      History   Chief Complaint Chief Complaint  Patient presents with   Abdominal Pain    having bad abdominal pain, along with bad nausea. - Entered by patient    HPI Allison Melendez is a 18 y.o. female.   Patient presents to clinic over concern of upper and lower abdominal discomfort with nausea for the past 2 weeks.  Patient does report a history of acid reflux.  She has not had diarrhea, constipation or emesis.  Denies fever. Abdominal pain has been waking her up in the middle of the night woke her up around 3 AM this morning which is why she came into clinic today. Nothing makes the pain better or worse. Seems to get better throughout the day and worse at night. She did take a urine pregnancy test yesterday at home which was negative.  Has had brown vaginal discharge that has been increased with some itching and was recently treated for chlamydia.  She completed her antibiotics for this and her partner completed antibiotics as well.  She has been sexually active since antibiotic completion with the same partner. Also reports urinary urgency and frequency a little last week, denies dysuria.   The history is provided by the patient and medical records.  Abdominal Pain   Past Medical History:  Diagnosis Date   Depression     There are no active problems to display for this patient.   History reviewed. No pertinent surgical history.  OB History   No obstetric history on file.      Home Medications    Prior to Admission medications  Medication Sig Start Date End Date Taking? Authorizing Provider  ferrous sulfate  325 (65 FE) MG tablet Take 1 tablet (325 mg total) by mouth daily. 01/16/24 04/19/24 Yes Robinson, John K, PA-C  omeprazole (PRILOSEC) 20 MG capsule Take 1 capsule (20 mg total) by mouth 2 (two) times daily before a meal. 04/19/24 05/19/24 Yes Ayrton Mcvay  N, FNP   dexmethylphenidate (FOCALIN XR) 5 MG 24 hr capsule Take 5 mg by mouth daily. Patient not taking: Reported on 02/28/2024 02/01/22   [provider]  docusate sodium  (COLACE) 100 MG capsule Take 1 capsule (100 mg total) by mouth daily. 03/13/24   Enedelia Dorna HERO, FNP  escitalopram (LEXAPRO) 10 MG tablet Take by mouth. Patient not taking: Reported on 02/28/2024 01/27/22   [provider]  polyethylene glycol powder (GLYCOLAX /MIRALAX ) 17 GM/SCOOP powder Take 17 g by mouth daily. 03/13/24   Enedelia Dorna HERO, FNP    Family History History reviewed. No pertinent family history.  Social History Social History[1]   Allergies   Patient has no known allergies.   Review of Systems Review of Systems  Per HPI  Physical Exam Triage Vital Signs ED Triage Vitals  Encounter Vitals Group     BP 04/19/24 0852 121/80     Girls Systolic BP Percentile --      Girls Diastolic BP Percentile --      Boys Systolic BP Percentile --      Boys Diastolic BP Percentile --      Pulse Rate 04/19/24 0852 87     Resp 04/19/24 0852 18     Temp 04/19/24 0852 98.6 F (37 C)     Temp Source 04/19/24 0852 Oral     SpO2 04/19/24 0852 99 %     Weight 04/19/24 0851  230 lb (104.3 kg)     Height 04/19/24 0851 5' 4 (1.626 m)     Head Circumference --      Peak Flow --      Pain Score 04/19/24 0850 7     Pain Loc --      Pain Education --      Exclude from Growth Chart --    No data found.  Updated Vital Signs BP 121/80 (BP Location: Left Arm)   Pulse 87   Temp 98.6 F (37 C) (Oral)   Resp 18   Ht 5' 4 (1.626 m)   Wt 230 lb (104.3 kg)   LMP 02/19/2024   SpO2 99%   BMI 39.48 kg/m   Visual Acuity Right Eye Distance:   Left Eye Distance:   Bilateral Distance:    Right Eye Near:   Left Eye Near:    Bilateral Near:     Physical Exam Vitals and nursing note reviewed.  Constitutional:      Appearance: Normal appearance. She is well-developed.  HENT:     Head:  Normocephalic and atraumatic.     Right Ear: External ear normal.     Left Ear: External ear normal.     Nose: Nose normal.     Mouth/Throat:     Mouth: Mucous membranes are moist.  Eyes:     Conjunctiva/sclera: Conjunctivae normal.  Cardiovascular:     Rate and Rhythm: Normal rate.  Pulmonary:     Effort: Pulmonary effort is normal. No respiratory distress.  Abdominal:     General: Abdomen is flat. Bowel sounds are normal.     Palpations: Abdomen is soft.     Tenderness: There is abdominal tenderness in the right upper quadrant and left lower quadrant. There is no guarding or rebound.     Hernia: No hernia is present.  Skin:    General: Skin is warm and dry.  Neurological:     General: No focal deficit present.     Mental Status: She is alert.  Psychiatric:        Mood and Affect: Mood normal.      UC Treatments / Results  Labs (all labs ordered are listed, but only abnormal results are displayed) Labs Reviewed  POCT URINE DIPSTICK - Abnormal; Notable for the following components:      Result Value   Clarity, UA turbid (*)    Bilirubin, UA small (*)    Spec Grav, UA >=1.030 (*)    All other components within normal limits  POCT URINE PREGNANCY  CERVICOVAGINAL ANCILLARY ONLY    EKG   Radiology No results found.  Procedures Procedures (including critical care time)  Medications Ordered in UC Medications - No data to display  Initial Impression / Assessment and Plan / UC Course  I have reviewed the triage vital signs and the nursing notes.  Pertinent labs & imaging results that were available during my care of the patient were reviewed by me and considered in my medical decision making (see chart for details).  Vitals and triage reviewed, patient is hemodynamically stable. Abdomen is soft w/ active BS, RUQ and LLQ TTP, w/o rebound or guarding.   Urinary frequency.  UA with bilirubin and high specific gravity, encouraged hydration.  No evidence of UTI.  Urine  pregnancy negative.  History of acid reflux that improved with antacids in the past.  Will trial omeprazole and dietary modifications.  Suspect this will help with epigastric pain and nausea.  Vaginal discharge with itching and recent chlamydia infection.  Cytology swab obtained.  Staff will contact if additional treatment is needed.  Plan of care, follow-up care and return precautions given, no questions at this time.    Final Clinical Impressions(s) / UC Diagnoses   Final diagnoses:  Abdominal pain, unspecified abdominal location  Urinary frequency  Vaginal discharge     Discharge Instructions      Take the omeprazole twice daily before meals  Drink at least 64 ounces of water daily  We will call if swab results are abnormal   Return to clinic for new or urgent symptoms      ED Prescriptions     Medication Sig Dispense Auth. Provider   omeprazole (PRILOSEC) 20 MG capsule Take 1 capsule (20 mg total) by mouth 2 (two) times daily before a meal. 60 capsule Dreama, Shayne Diguglielmo  N, FNP      PDMP not reviewed this encounter.     [1]  Social History Tobacco Use   Smoking status: Never   Smokeless tobacco: Never  Vaping Use   Vaping status: Every Day  Substance Use Topics   Alcohol use: Yes    Comment: occasionally   Drug use: Never     Dreama Twana SAILOR, FNP 04/19/24 0944  "

## 2024-04-19 NOTE — Discharge Instructions (Addendum)
 Take the omeprazole twice daily before meals  Drink at least 64 ounces of water daily  We will call if swab results are abnormal   Return to clinic for new or urgent symptoms

## 2024-04-19 NOTE — ED Triage Notes (Addendum)
 Patient c/o abdominal and upper GI discomfort associated w/nausea x 2 weeks.  History of acid reflux.  Denies any OTC meds, no diarrhea and no vomiting.  Patient did take a HPT last night which was negative.

## 2024-04-20 LAB — CERVICOVAGINAL ANCILLARY ONLY
Bacterial Vaginitis (gardnerella): NEGATIVE
Candida Glabrata: NEGATIVE
Candida Vaginitis: NEGATIVE
Chlamydia: NEGATIVE
Comment: NEGATIVE
Comment: NEGATIVE
Comment: NEGATIVE
Comment: NEGATIVE
Comment: NEGATIVE
Comment: NORMAL
Neisseria Gonorrhea: NEGATIVE
Trichomonas: NEGATIVE

## 2024-05-12 ENCOUNTER — Telehealth: Payer: MEDICAID | Admitting: Family

## 2024-05-12 ENCOUNTER — Telehealth: Payer: MEDICAID

## 2024-05-12 ENCOUNTER — Encounter: Payer: MEDICAID | Admitting: Family Medicine

## 2024-05-12 DIAGNOSIS — J069 Acute upper respiratory infection, unspecified: Secondary | ICD-10-CM

## 2024-05-12 MED ORDER — BENZONATATE 200 MG PO CAPS: 200.0000 mg | ORAL_CAPSULE | Freq: Two times a day (BID) | ORAL | 0 refills | Status: AC | PRN

## 2024-05-12 MED ORDER — FLUTICASONE PROPIONATE 50 MCG/ACT NA SUSP: 2.0000 | Freq: Every day | NASAL | 6 refills | Status: AC

## 2024-05-12 MED ORDER — PROMETHAZINE-DM 6.25-15 MG/5ML PO SYRP: 5.0000 mL | ORAL_SOLUTION | Freq: Three times a day (TID) | ORAL | 0 refills | Status: AC | PRN

## 2024-05-12 MED ORDER — CETIRIZINE HCL 10 MG PO TABS: 10.0000 mg | ORAL_TABLET | Freq: Every day | ORAL | 1 refills | Status: AC

## 2024-05-12 NOTE — Patient Instructions (Signed)

## 2024-05-12 NOTE — Progress Notes (Signed)
" °  Thank you for the details you included in the comment boxes. Those details are very helpful in determining the best course of treatment for you and help us  to provide the best care.Because you have flu and it is has been several days and you have shortness of breath, we recommend that you schedule a Virtual Urgent Care video visit in order for the provider to better assess what is going on.  The provider will be able to give you a more accurate diagnosis and treatment plan if we can more freely discuss your symptoms and with the addition of a virtual examination.   If you change your visit to a video visit, we will bill your insurance (similar to an office visit) and you will not be charged for this e-Visit. You will be able to stay at home and speak with the first available [facility] advanced practice provider. The link to do a video visit is in the drop down Menu tab of your Welcome screen in MyChart. "

## 2024-05-12 NOTE — Progress Notes (Signed)
 " Virtual Visit Consent   [name], you are scheduled for a virtual visit with a [facility] provider today. Just as with appointments in the office, your consent must be obtained to participate. Your consent will be active for this visit and any virtual visit you may have with one of our providers in the next 365 days. If you have a MyChart account, a copy of this consent can be sent to you electronically.  As this is a virtual visit, video technology does not allow for your provider to perform a traditional examination. This may limit your provider's ability to fully assess your condition. If your provider identifies any concerns that need to be evaluated in person or the need to arrange testing (such as labs, EKG, etc.), we will make arrangements to do so. Although advances in technology are sophisticated, we cannot ensure that it will always work on either your end or our end. If the connection with a video visit is poor, the visit may have to be switched to a telephone visit. With either a video or telephone visit, we are not always able to ensure that we have a secure connection.  By engaging in this virtual visit, you consent to the provision of healthcare and authorize for your insurance to be billed (if applicable) for the services provided during this visit. Depending on your insurance coverage, you may receive a charge related to this service.  I need to obtain your verbal consent now. Are you willing to proceed with your visit today? [name] has provided verbal consent on 05/12/2024 for a virtual visit (video or telephone). [name], FNP  Date: 05/12/2024 5:19 PM   Virtual Visit via Video Note   I, [name], connected with  Allison Melendez  ([id number], [birth year]) on 05/12/24 at  5:30 PM EST by a video-enabled telemedicine application and verified that I am speaking with the correct person using two identifiers.  Location: Patient: Virtual Visit Location  Patient: Home Provider: Virtual Visit Location Provider: Home Office   I discussed the limitations of evaluation and management by telemedicine and the availability of in person appointments. The patient expressed understanding and agreed to proceed.    History of Present Illness: Allison Melendez is a 19 y.o. who identifies as a female who was assigned female at birth, and is being seen today for cough and congestion that started two days ago.  HPI: URI  This is a new problem. The current episode started in the past 7 days. The problem has been gradually worsening. There has been no fever. Associated symptoms include congestion, coughing, headaches, joint pain, rhinorrhea, sinus pain, sneezing and a sore throat. Pertinent negatives include no ear pain. She has tried acetaminophen  and increased fluids for the symptoms. The treatment provided mild relief.    Problems: There are no active problems to display for this patient.   Allergies: Allergies[1] Medications: Current Medications[2]  Observations/Objective: Patient is well-developed, well-nourished in no acute distress.  Resting comfortably  at home.  Head is normocephalic, atraumatic.  No labored breathing.  Speech is clear and coherent with logical content.  Patient is alert and oriented at baseline.  Nasal congestion Dry nonproductive cough  Assessment and Plan: 1. Viral URI (Primary) - benzonatate  (TESSALON ) 200 MG capsule; Take 1 capsule (200 mg total) by mouth 2 (two) times daily as needed for cough.  Dispense: 20 capsule; Refill: 0 - promethazine -dextromethorphan (PROMETHAZINE -DM) 6.25-15 MG/5ML syrup; Take 5 mLs by mouth 3 (three) times daily as needed  for cough.  Dispense: 118 mL; Refill: 0 - fluticasone  (FLONASE ) 50 MCG/ACT nasal spray; Place 2 sprays into both nostrils daily.  Dispense: 16 g; Refill: 6 - cetirizine  (ZYRTEC  ALLERGY) 10 MG tablet; Take 1 tablet (10 mg total) by mouth daily.  Dispense: 90 tablet; Refill:  1  - Take meds as prescribed - Use a cool mist humidifier  -Use saline nose sprays frequently -Force fluids -For any cough or congestion  Use plain Mucinex- regular strength or max strength is fine -For fever or aces or pains- take tylenol  or ibuprofen . -Throat lozenges if help Follow up if symptoms worsen or do not improve   Follow Up Instructions: I discussed the assessment and treatment plan with the patient. The patient was provided an opportunity to ask questions and all were answered. The patient agreed with the plan and demonstrated an understanding of the instructions.  A copy of instructions were sent to the patient via MyChart unless otherwise noted below.     The patient was advised to call back or seek an in-person evaluation if the symptoms worsen or if the condition fails to improve as anticipated.    [name], FNP    [1] No Known Allergies [2]  Current Outpatient Medications:    benzonatate  (TESSALON ) 200 MG capsule, Take 1 capsule (200 mg total) by mouth 2 (two) times daily as needed for cough., Disp: 20 capsule, Rfl: 0   cetirizine  (ZYRTEC  ALLERGY) 10 MG tablet, Take 1 tablet (10 mg total) by mouth daily., Disp: 90 tablet, Rfl: 1   fluticasone  (FLONASE ) 50 MCG/ACT nasal spray, Place 2 sprays into both nostrils daily., Disp: 16 g, Rfl: 6   promethazine -dextromethorphan (PROMETHAZINE -DM) 6.25-15 MG/5ML syrup, Take 5 mLs by mouth 3 (three) times daily as needed for cough., Disp: 118 mL, Rfl: 0   dexmethylphenidate (FOCALIN XR) 5 MG 24 hr capsule, Take 5 mg by mouth daily. (Patient not taking: Reported on 02/28/2024), Disp: , Rfl:    docusate sodium  (COLACE) 100 MG capsule, Take 1 capsule (100 mg total) by mouth daily., Disp: 30 capsule, Rfl: 0   escitalopram (LEXAPRO) 10 MG tablet, Take by mouth. (Patient not taking: Reported on 02/28/2024), Disp: , Rfl:    ferrous sulfate  325 (65 FE) MG tablet, Take 1 tablet (325 mg total) by mouth daily., Disp: 30 tablet, Rfl: 0    omeprazole  (PRILOSEC) 20 MG capsule, Take 1 capsule (20 mg total) by mouth 2 (two) times daily before a meal., Disp: 60 capsule, Rfl: 0   polyethylene glycol powder (GLYCOLAX /MIRALAX ) 17 GM/SCOOP powder, Take 17 g by mouth daily., Disp: 255 g, Rfl: 0  "
